# Patient Record
Sex: Female | Born: 2000 | Race: Black or African American | Hispanic: No | Marital: Single | Smoking: Never smoker
Health system: Southern US, Community
[De-identification: ages and names within clinical notes are randomized; demographics above are authoritative.]

## PROBLEM LIST (undated history)

## (undated) DIAGNOSIS — J45909 Unspecified asthma, uncomplicated: Secondary | ICD-10-CM

## (undated) DIAGNOSIS — F419 Anxiety disorder, unspecified: Secondary | ICD-10-CM

## (undated) HISTORY — DX: Unspecified asthma, uncomplicated: J45.909

## (undated) HISTORY — DX: Anxiety disorder, unspecified: F41.9

---
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
## 2020-04-03 ENCOUNTER — Ambulatory Visit (INDEPENDENT_AMBULATORY_CARE_PROVIDER_SITE_OTHER): Payer: Managed Care, Other (non HMO) | Admitting: Obstetrics & Gynecology

## 2020-04-03 ENCOUNTER — Other Ambulatory Visit: Payer: Self-pay

## 2020-04-03 ENCOUNTER — Encounter: Payer: Self-pay | Admitting: Obstetrics & Gynecology

## 2020-04-03 VITALS — BP 118/78 | HR 68 | Temp 97.5°F | Resp 16 | Ht 64.75 in | Wt 146.0 lb

## 2020-04-03 DIAGNOSIS — Z113 Encounter for screening for infections with a predominantly sexual mode of transmission: Secondary | ICD-10-CM

## 2020-04-03 DIAGNOSIS — Z01419 Encounter for gynecological examination (general) (routine) without abnormal findings: Secondary | ICD-10-CM | POA: Diagnosis not present

## 2020-04-03 MED ORDER — NORETHIN ACE-ETH ESTRAD-FE 1-20 MG-MCG PO TABS
1.0000 | ORAL_TABLET | Freq: Every day | ORAL | 3 refills | Status: DC
Start: 2020-04-03 — End: 2020-06-27

## 2020-04-03 NOTE — Progress Notes (Addendum)
19 y.o. G0P0000 Single Black or [place] American female here for new patient exam and to discuss contraception.  She is sexually active. This is her first partner and his first partner.   Options for contraception discussed today.    She finishing freshman year at [employer] this spring.  Not sure about summer plans.   Patient's last menstrual period was 03/20/2020 (exact date).          Sexually active: Yes.    The current method of family planning is none.    Exercising: No.  exercise Smoker:  no  Health Maintenance: Pap:  none History of abnormal Pap:  no MMG:  None Tdap: 2013 Screening Labs: none obtained   reports that she has never smoked. She has never used smokeless tobacco. She reports that she does not drink alcohol or use drugs.  Past Medical History:  Diagnosis Date  . Anxiety   . Asthma     History reviewed. No pertinent surgical history.  Current Outpatient Medications  Medication Sig Dispense Refill  . albuterol (PROVENTIL HFA;VENTOLIN HFA) 108 (90 Base) MCG/ACT inhaler Inhale 2 puffs into the lungs every 6 (six) hours as needed for wheezing. 1 Inhaler 1  . albuterol (PROVENTIL) (2.5 MG/3ML) 0.083% nebulizer solution Take 3 mLs (2.5 mg total) by nebulization every 4 (four) hours as needed for wheezing. 150 mL 1  . cetirizine (ZYRTEC) 10 MG tablet Take 1 tablet (10 mg total) by mouth daily. 30 tablet 11  . fluticasone (FLONASE) 50 MCG/ACT nasal spray Place 2 sprays into both nostrils daily. 16 g 6  . Spacer/Aero Chamber Mouthpiece MISC 1 each by Does not apply route every 6 (six) hours as needed. 1 each 0   No current facility-administered medications for this visit.    Family History  Problem Relation Age of Onset  . Hypertension Brother   . Ovarian cancer Maternal Grandmother        & cervical  . Diabetes Maternal Grandfather   . Ovarian cancer Paternal Grandmother        & cervical    Review of Systems  Constitutional: Negative.   HENT:  Negative.   Eyes: Negative.   Respiratory: Negative.   Cardiovascular: Negative.   Gastrointestinal: Negative.   Endocrine: Negative.   Genitourinary: Negative.   Musculoskeletal: Negative.   Skin: Negative.   Allergic/Immunologic: Negative.   Neurological: Negative.   Psychiatric/Behavioral: Negative.     Exam:   BP 118/78   Pulse 68   Temp (!) 97.5 F (36.4 C) (Skin)   Resp 16   Ht 5' 4.75" (1.645 m)   Wt 146 lb (66.2 kg)   LMP 03/20/2020 (Exact Date)   BMI 24.48 kg/m   Height: 5' 4.75" (164.5 cm)  General appearance: alert, cooperative and appears stated age Head: Normocephalic, without obvious abnormality, atraumatic Neck: no adenopathy, supple, symmetrical, trachea midline and thyroid normal to inspection and palpation Lungs: clear to auscultation bilaterally Breasts: normal appearance, no masses or tenderness Heart: regular rate and rhythm Abdomen: soft, non-tender; bowel sounds normal; no masses,  no organomegaly Extremities: extremities normal, atraumatic, no cyanosis or edema Skin: Skin color, texture, turgor normal. No rashes or lesions Lymph nodes: Cervical, supraclavicular, and axillary nodes normal. No abnormal inguinal nodes palpated Neurologic: Grossly normal   Pelvic: External genitalia:  no lesions              Urethra:  normal appearing urethra with no masses, tenderness or lesions  Bartholins and Skenes: normal                 Vagina: normal appearing vagina with normal color and discharge, no lesions              Cervix: no lesions              Pap taken: No. Bimanual Exam:  Uterus:  normal size, contour, position, consistency, mobility, non-tender              Adnexa: normal adnexa and no mass, fullness, tenderness               Rectovaginal: Confirms               Anus:  normal sphincter tone, no lesions  Chaperone, Royal Hawthorn, CMA, was present for exam.  A:  Well Woman with normal exam SA and desires  contraception Asthma Seasonal allergies  P:   Mammogram guidelines reviewed.   pap smear not indicated GC/Chl obtained today with dirty urine Will start Loestrin 1/20 FE one po q day with onset of cycle.  #1 month supply/3RD.  Recheck 3 months.  Risks discussed with pt in detail including DUB, DVT/PE, headache, nausea, increased BP.  Instruction sheet provided and reviewed with pt when to start and what to do if misses pill/pills. Release of records from [facility] for Gardisil vaccination dates. return annually or prn

## 2020-04-05 LAB — GC/CHLAMYDIA PROBE AMP
Chlamydia trachomatis, NAA: POSITIVE — AB
Neisseria Gonorrhoeae by PCR: NEGATIVE

## 2020-04-07 ENCOUNTER — Telehealth: Payer: Self-pay | Admitting: *Deleted

## 2020-04-07 MED ORDER — AZITHROMYCIN 1 G PO PACK: 1.0000 | PACK | Freq: Once | ORAL | 0 refills | Status: AC

## 2020-04-07 NOTE — Telephone Encounter (Signed)
Message left to return call to Triage Nurse at [phone].    

## 2020-04-07 NOTE — Telephone Encounter (Signed)
Attempted to reach patient to review results. Error message stating, "mail service has not been established on number you are trying to reach."

## 2020-04-07 NOTE — Telephone Encounter (Signed)
-----   Message from Jerene Bears, MD sent at 04/07/2020  1:33 PM EDT ----- Please let pt know her chlamydia testing is positive.  She told me that she and her partner both never had prior partners, just [place].  Needs treatment with azithromycin 1 gram po x 1.  Ok for expedited partner therapy as well.  Needs repeating testing 3 months.  CC:  Cornelia Copa, CMA

## 2020-04-07 NOTE — Telephone Encounter (Signed)
Call to patient. Message given to patient as seen below from Dr. Hyacinth Meeker and patient verbalized understanding. Patient declines EPT at this time. RN advised of importance of treatment for both patient and her partner. Advised to avoid intercourse until both are treated and use condoms. Patient verbalized understanding. Patient requests prescription be [facility] to [facility] [place]. Instructions on use of azithromycin reviewed with patient and she verbalized understanding. Prescription for azithromycin 1gm powder #1, 0RF sent to confirmed pharmacy. 3 month recheck scheduled for 06-27-20 at 1530. Patient agreeable to date and time of appointment.   Confidential Communicable Disease Report filled out and faxed to [facility] Attn: Brandy Sessoms.   Routing to provider and will close encounter.

## 2020-04-13 ENCOUNTER — Telehealth: Payer: Self-pay

## 2020-04-13 NOTE — Telephone Encounter (Signed)
Patient was notified that per [facility] records that she only completed 1 gardasil on 09-26-2015. She is aware that she only needs to get 2 more shots & does not need to restart vaccination. Patient declined to schedule 2nd gardasil appointment. She is aware of importance of having all 3 series complete. Routing to Dr Hyacinth Meeker.

## 2020-06-26 NOTE — Progress Notes (Signed)
GYNECOLOGY  VISIT  CC:   History of chlamydia, recheck  HPI: 19 y.o. G0P0000 Single Black or [place] American female here for toc of chlamydia.  She and boyfriend are not together.  She was asymptomatic from the chlamydia.    Not currently SA at this point.  Stopped OCPs as not SA.  Denies vaginal odor or discharge.    Additional STD testing discussed.  H/O Hep B vaccination as a child.  GYNECOLOGIC HISTORY: Patient's last menstrual period was 06/21/2020 (exact date). Contraception: abstinence Menopausal hormone therapy: none  Patient Active Problem List   Diagnosis Date Noted  . 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 Anxiety   . Asthma     History reviewed. No pertinent surgical history.  MEDS:   Current Outpatient Medications on File Prior to Visit  Medication Sig Dispense Refill  . albuterol (PROVENTIL HFA;VENTOLIN HFA) 108 (90 Base) MCG/ACT inhaler Inhale 2 puffs into the lungs every 6 (six) hours as needed for wheezing. 1 Inhaler 1  . albuterol (PROVENTIL) (2.5 MG/3ML) 0.083% nebulizer solution Take 3 mLs (2.5 mg total) by nebulization every 4 (four) hours as needed for wheezing. 150 mL 1  . cetirizine (ZYRTEC) 10 MG tablet Take 1 tablet (10 mg total) by mouth daily. 30 tablet 11  . ipratropium (ATROVENT) 0.06 % nasal spray Place 2 sprays into both nostrils 3 (three) times daily.    Marland Kitchen Spacer/Aero Chamber Mouthpiece MISC 1 each by Does not apply route every 6 (six) hours as needed. 1 each 0   No current facility-administered medications on file prior to visit.    ALLERGIES: Peanuts [peanut oil]  Family History  Problem Relation Age of Onset  . Hypertension Brother   . Ovarian cancer Maternal Grandmother        & cervical  . Diabetes Maternal Grandfather   . Ovarian cancer Paternal Grandmother        & cervical    SH:  Single, non smoker  Review of Systems  Constitutional: Negative.   HENT: Negative.   Eyes:  Negative.   Respiratory: Negative.   Cardiovascular: Negative.   Gastrointestinal: Negative.   Endocrine: Negative.   Genitourinary: Negative.   Musculoskeletal: Negative.   Skin: Negative.   Allergic/Immunologic: Negative.   Neurological: Negative.   Hematological: Negative.   Psychiatric/Behavioral: Negative.     PHYSICAL EXAMINATION:    BP 120/68   Pulse 68   Resp 16   Wt 147 lb (66.7 kg)   LMP 06/21/2020 (Exact Date)   BMI 24.65 kg/m     General appearance: alert, cooperative and appears stated age Abdomen: soft, non-tender; bowel sounds normal; no masses,  no organomegaly Lymph:  no inguinal LAD noted  Pelvic: External genitalia:  no lesions              Urethra:  normal appearing urethra with no masses, tenderness or lesions              Bartholins and Skenes: normal                 Vagina: normal appearing vagina with normal color and discharge, no lesions              Cervix: no lesions              Bimanual Exam:  Uterus:  normal size, contour, position, consistency, mobility, non-tender  Adnexa: no mass, fullness, tenderness  Chaperone, Cornelia Copa, CMA, was present for exam.  Assessment: H/o chlamydia Not currently SA and off contraception  Plan: GC/Chl/trich obtained today Will also check HIV, RPR, and Hep C antibody  .

## 2020-06-27 ENCOUNTER — Encounter: Payer: Self-pay | Admitting: Obstetrics & Gynecology

## 2020-06-27 ENCOUNTER — Ambulatory Visit (INDEPENDENT_AMBULATORY_CARE_PROVIDER_SITE_OTHER): Payer: Managed Care, Other (non HMO) | Admitting: Obstetrics & Gynecology

## 2020-06-27 ENCOUNTER — Other Ambulatory Visit: Payer: Self-pay

## 2020-06-27 VITALS — BP 120/68 | HR 68 | Resp 16 | Wt 147.0 lb

## 2020-06-27 DIAGNOSIS — Z8619 Personal history of other infectious and parasitic diseases: Secondary | ICD-10-CM

## 2020-06-28 LAB — RPR: RPR Ser Ql: NONREACTIVE

## 2020-06-28 LAB — HEPATITIS C ANTIBODY: Hep C Virus Ab: <0.1 s/co ratio (ref 0.0–0.9)

## 2020-06-28 LAB — HIV ANTIBODY (ROUTINE TESTING W REFLEX): HIV Screen 4th Generation wRfx: NONREACTIVE

## 2020-06-29 LAB — CHLAMYDIA/GONOCOCCUS/TRICHOMONAS, NAA
Chlamydia by NAA: NEGATIVE
Gonococcus by NAA: NEGATIVE
Trich vag by NAA: NEGATIVE

## 2020-07-03 ENCOUNTER — Telehealth: Payer: Self-pay

## 2020-07-03 NOTE — Telephone Encounter (Signed)
Patient cancelled office visit for 3 month recheck of OCP. Patient did not wish to reschedule.

## 2020-07-06 ENCOUNTER — Ambulatory Visit: Payer: Managed Care, Other (non HMO) | Admitting: Obstetrics & Gynecology

## 2020-09-01 ENCOUNTER — Encounter: Payer: Self-pay | Admitting: Family

## 2020-09-01 ENCOUNTER — Ambulatory Visit (INDEPENDENT_AMBULATORY_CARE_PROVIDER_SITE_OTHER): Payer: Managed Care, Other (non HMO) | Admitting: Family

## 2020-09-01 ENCOUNTER — Other Ambulatory Visit: Payer: Managed Care, Other (non HMO)

## 2020-09-01 DIAGNOSIS — J029 Acute pharyngitis, unspecified: Secondary | ICD-10-CM | POA: Diagnosis not present

## 2020-09-01 DIAGNOSIS — Z20822 Contact with and (suspected) exposure to covid-19: Secondary | ICD-10-CM | POA: Diagnosis not present

## 2020-09-01 NOTE — Progress Notes (Signed)
   Virtual Visit via telephone Note Due to COVID-19 pandemic this visit was conducted virtually. This visit type was conducted due to national recommendations for restrictions regarding the COVID-19 Pandemic (e.g. social distancing, sheltering in place) in an effort to limit this patient's exposure and mitigate transmission in our community. All issues noted in this document were discussed and addressed.  A physical exam was not performed with this format.  I connected with [name] on 09/01/20 at 9:25 AM  by telephone and verified that I am speaking with the correct person using two identifiers. Anne Pace is currently located at home and no one is currently with her during visit. The provider, Jannifer Rodney, FNP is located in their office at time of visit.  I discussed the limitations, risks, security and privacy concerns of performing an evaluation and management service by telephone and the availability of in person appointments. I also discussed with the patient that there may be a patient responsible charge related to this service. The patient expressed understanding and agreed to proceed.   History and Present Illness:  Cough This is a new problem. The current episode started in the past 7 days. The problem has been gradually worsening. The problem occurs constantly. The cough is non-productive. Associated symptoms include chills, ear congestion, ear pain, a fever, headaches, myalgias, nasal congestion and a sore throat. Pertinent negatives include no shortness of breath or wheezing. The symptoms are aggravated by lying down. She has tried rest and OTC cough suppressant for the symptoms. The treatment provided mild relief.      Review of Systems  Constitutional: Positive for chills and fever.  HENT: Positive for ear pain and sore throat.   Respiratory: Positive for cough. Negative for shortness of breath and wheezing.   Musculoskeletal: Positive for myalgias.  Neurological:  Positive for headaches.  All other systems reviewed and are negative.    Observations/Objective: No SOB or distress noted, hoarse vocie  Assessment and Plan: Anne Pace comes in today with chief complaint of No chief complaint on file.   Diagnosis and orders addressed:  1. Sore throat - Novel Coronavirus, NAA ([facility])  2. Encounter by telehealth for suspected COVID-19 Rest Force fluids Tylenol as needed OTC decongestant as needed    I discussed the assessment and treatment plan with the patient. The patient was provided an opportunity to ask questions and all were answered. The patient agreed with the plan and demonstrated an understanding of the instructions.   The patient was advised to call back or seek an in-person evaluation if the symptoms worsen or if the condition fails to improve as anticipated.  The above assessment and management plan was discussed with the patient. The patient verbalized understanding of and has agreed to the management plan. Patient is aware to call the clinic if symptoms persist or worsen. Patient is aware when to return to the clinic for a follow-up visit. Patient educated on when it is appropriate to go to the emergency department.   Time call ended:  9:34 AM   I provided 9  minutes of non-face-to-face time during this encounter.    Jannifer Rodney, FNP

## 2020-09-02 LAB — SARS-COV-2, NAA 2 DAY TAT

## 2020-09-02 LAB — NOVEL CORONAVIRUS, NAA: SARS-CoV-2, NAA: NOT DETECTED

## 2021-05-14 ENCOUNTER — Ambulatory Visit: Payer: Managed Care, Other (non HMO) | Admitting: Nurse Practitioner

## 2021-05-16 ENCOUNTER — Encounter: Payer: Self-pay | Admitting: Nurse Practitioner

## 2021-06-20 ENCOUNTER — Other Ambulatory Visit: Payer: Self-pay

## 2021-06-20 ENCOUNTER — Telehealth: Payer: Self-pay | Admitting: Nurse Practitioner

## 2021-06-20 DIAGNOSIS — J4531 Mild persistent asthma with (acute) exacerbation: Secondary | ICD-10-CM

## 2021-06-20 DIAGNOSIS — J4530 Mild persistent asthma, uncomplicated: Secondary | ICD-10-CM

## 2021-06-20 DIAGNOSIS — J4520 Mild intermittent asthma, uncomplicated: Secondary | ICD-10-CM

## 2021-06-20 MED ORDER — ALBUTEROL SULFATE HFA 108 (90 BASE) MCG/ACT IN AERS: 2.0000 | INHALATION_SPRAY | Freq: Four times a day (QID) | RESPIRATORY_TRACT | 0 refills | Status: DC | PRN

## 2021-06-20 MED ORDER — ALBUTEROL SULFATE (2.5 MG/3ML) 0.083% IN NEBU: 2.5000 mg | INHALATION_SOLUTION | RESPIRATORY_TRACT | 0 refills | Status: DC | PRN

## 2021-06-20 NOTE — Telephone Encounter (Signed)
  Prescription Request  06/20/2021  What is the name of the medication or equipment? albuterol (PROVENTIL HFA;VENTOLIN HFA) 108 (90 Base) MCG/ACT inhaler  Have you contacted your pharmacy to request a refill? (if applicable) no  Which pharmacy would you like this sent to? [facility] has apt 06/28/2021   Patient notified that their request is being sent to the clinical staff for review and that they should receive a response within 2 business days.

## 2021-06-20 NOTE — Telephone Encounter (Signed)
Sent in one refill. Pt is scheduled with Gennette Pac on 06/28/21.

## 2021-06-28 ENCOUNTER — Encounter: Payer: Self-pay | Admitting: Nurse Practitioner

## 2021-06-28 ENCOUNTER — Ambulatory Visit: Payer: Managed Care, Other (non HMO) | Admitting: Nurse Practitioner

## 2021-11-28 ENCOUNTER — Encounter: Payer: Self-pay | Admitting: Family Medicine

## 2021-11-28 ENCOUNTER — Ambulatory Visit: Payer: Managed Care, Other (non HMO) | Admitting: Family Medicine

## 2021-11-28 VITALS — BP 126/85 | HR 130 | Temp 100.6°F | Ht 64.75 in | Wt 143.0 lb

## 2021-11-28 DIAGNOSIS — J029 Acute pharyngitis, unspecified: Secondary | ICD-10-CM | POA: Diagnosis not present

## 2021-11-28 DIAGNOSIS — R21 Rash and other nonspecific skin eruption: Secondary | ICD-10-CM | POA: Diagnosis not present

## 2021-11-28 NOTE — Progress Notes (Signed)
Assessment & Plan:  1. Sore throat Continue Augmentin. Possible mono given her symptoms. Education provided on mono.  - Mononucleosis Test, Qual W/ Reflex  2. Rash Advised to take famotidine and antihistamine at onset of rash if it returns.    Follow up plan: Return if symptoms worsen or fail to improve.  Deliah Boston, MSN, APRN, FNP-C [facility]  Subjective:   Patient ID: Anne Pace, female    DOB: 2001-01-13, 20 y.o.   MRN: [id number]  HPI: Anne Pace is a 20 y.o. female presenting on 11/28/2021 for Follow-up (Urgent care follow up- 12/16. Patient states that she is still having sore throat, congestion & headache )  Patient is accompanied by her mom, who she is okay with being present.  Patient has had a headache, sore throat, and fever x1 week. She was seen at urgent care two days ago at which time her COVID and influenza tests were negative. Patient reports they also tested her for strep throat - rapid negative, culture sent off. She has not heard from the culture results. These results are not available in Epic. She was started on Augmentin at that time and reports no improvement in her symptoms. At the same time her throat started hurting, she also broke out in a rash which has been treated with steroids and has improved.    ROS: Negative unless specifically indicated above in HPI.   Relevant past medical history reviewed and updated as indicated.   Allergies and medications reviewed and updated.   Current Outpatient Medications:    albuterol (PROVENTIL) (2.5 MG/3ML) 0.083% nebulizer solution, Take 3 mLs (2.5 mg total) by nebulization every 4 (four) hours as needed for wheezing., Disp: 150 mL, Rfl: 0   albuterol (VENTOLIN HFA) 108 (90 Base) MCG/ACT inhaler, Inhale 2 puffs into the lungs every 6 (six) hours as needed for wheezing., Disp: 1 each, Rfl: 0   amoxicillin (AMOXIL) 875 MG tablet, Take 1 tablet by mouth daily., Disp: , Rfl:     cetirizine (ZYRTEC) 10 MG tablet, Take 1 tablet (10 mg total) by mouth daily., Disp: 30 tablet, Rfl: 11   ipratropium (ATROVENT) 0.06 % nasal spray, Place 2 sprays into both nostrils 3 (three) times daily., Disp: , Rfl:    predniSONE (DELTASONE) 5 MG tablet, Take 6-5-4-3-2-1 po qd, Disp: , Rfl:    Spacer/Aero Chamber Mouthpiece MISC, 1 each by Does not apply route every 6 (six) hours as needed., Disp: 1 each, Rfl: 0  Allergies  Allergen Reactions   Peanut-Containing Drug Products Hives   Peanuts [Peanut Oil]     Objective:   BP 126/85    Pulse (!) 130    Temp (!) 100.6 F (38.1 C) (Temporal)    Ht 5' 4.75" (1.645 m)    Wt 143 lb (64.9 kg)    SpO2 98%    BMI 23.98 kg/m    Physical Exam Vitals reviewed.  Constitutional:      General: She is not in acute distress.    Appearance: Normal appearance. She is not ill-appearing, toxic-appearing or diaphoretic.  HENT:     Head: Normocephalic and atraumatic.     Right Ear: Ear canal and external ear normal. There is impacted cerumen.     Left Ear: Ear canal and external ear normal. There is impacted cerumen.     Nose:     Right Sinus: No maxillary sinus tenderness or frontal sinus tenderness.     Left Sinus: No maxillary  sinus tenderness or frontal sinus tenderness.     Mouth/Throat:     Mouth: Mucous membranes are moist.     Pharynx: Oropharyngeal exudate and posterior oropharyngeal erythema present.     Tonsils: Tonsillar exudate present. 4+ on the right. 4+ on the left.  Eyes:     General: No scleral icterus.       Right eye: No discharge.        Left eye: No discharge.     Conjunctiva/sclera: Conjunctivae normal.  Cardiovascular:     Rate and Rhythm: Normal rate and regular rhythm.     Heart sounds: Normal heart sounds. No murmur heard.   No friction rub. No gallop.  Pulmonary:     Effort: Pulmonary effort is normal. No respiratory distress.     Breath sounds: Normal breath sounds. No stridor. No wheezing, rhonchi or rales.   Musculoskeletal:        General: Normal range of motion.     Cervical back: Normal range of motion.  Lymphadenopathy:     Cervical: No cervical adenopathy.  Skin:    General: Skin is warm and dry.     Capillary Refill: Capillary refill takes less than 2 seconds.  Neurological:     General: No focal deficit present.     Mental Status: She is alert and oriented to person, place, and time. Mental status is at baseline.  Psychiatric:        Mood and Affect: Mood normal.        Behavior: Behavior normal.        Thought Content: Thought content normal.        Judgment: Judgment normal.

## 2021-11-29 LAB — MONO QUAL W/RFLX QN: Mono Qual W/Rflx Qn: POSITIVE — AB

## 2021-11-29 LAB — MONONUCLEOSIS TEST, QUANT: MONO TITER: 1:8 {titer} — ABNORMAL HIGH

## 2022-05-30 ENCOUNTER — Ambulatory Visit
Admission: RE | Admit: 2022-05-30 | Discharge: 2022-05-30 | Disposition: A | Discharge status: Home / Self Care | Payer: 59 | Source: Ambulatory Visit | Attending: Family Medicine | Admitting: Family Medicine

## 2022-05-30 VITALS — BP 119/82 | HR 98 | Temp 98.9°F | Resp 18

## 2022-05-30 DIAGNOSIS — J069 Acute upper respiratory infection, unspecified: Secondary | ICD-10-CM | POA: Diagnosis not present

## 2022-05-30 LAB — POCT RAPID STREP A (OFFICE): Rapid Strep A Screen: NEGATIVE

## 2022-05-30 MED ORDER — PROMETHAZINE-DM 6.25-15 MG/5ML PO SYRP: 5.0000 mL | ORAL_SOLUTION | Freq: Four times a day (QID) | ORAL | 0 refills | Status: DC | PRN

## 2022-05-30 NOTE — ED Triage Notes (Signed)
Pt reports fever, nasal congestion and sore throat x 3 days. Tylenol gives relief,

## 2022-05-30 NOTE — ED Provider Notes (Signed)
[facility]    CSN: [id number] Arrival date & time: 05/30/22  1551      History   Chief Complaint Chief Complaint  Patient presents with   Fever    Nasal Congestion, Sore Throat, Body Aches - Entered by patient   Nasal Congestion        Sore Throat         HPI Anne Pace is a 21 y.o. female.   Presenting today with 3-day history of fever, chills, body aches, congestion, sore throat, cough.  Denies chest pain, shortness of breath, abdominal pain, nausea vomiting or diarrhea.  Trying over-the-counter cold and congestion medications with minimal relief.  History of asthma on albuterol as needed, has not needed her inhaler since onset of symptoms.  No known sick contacts recently.    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 No pertinent surgical history.  OB History     Gravida  0   Para  0   Term  0   Preterm  0   AB  0   Living  0      SAB  0   IAB  0   Ectopic  0   Multiple  0   Live Births  0            Home Medications    Prior to Admission medications   Medication Sig Start Date End Date Taking? Authorizing Provider  promethazine-dextromethorphan (PROMETHAZINE-DM) 6.25-15 MG/5ML syrup Take 5 mLs by mouth 4 (four) times daily as needed. 05/30/22  Yes Particia Nearing, PA-C  albuterol (PROVENTIL) (2.5 MG/3ML) 0.083% nebulizer solution Take 3 mLs (2.5 mg total) by nebulization every 4 (four) hours as needed for wheezing. 06/20/21   Daphine Deutscher Mary-Margaret, FNP  albuterol (VENTOLIN HFA) 108 (90 Base) MCG/ACT inhaler Inhale 2 puffs into the lungs every 6 (six) hours as needed for wheezing. 06/20/21   Daphine Deutscher, Mary-Margaret, FNP  amoxicillin (AMOXIL) 875 MG tablet Take 1 tablet by mouth daily.    [provider]  cetirizine (ZYRTEC) 10 MG tablet Take 1  tablet (10 mg total) by mouth daily. 02/08/17   Johna Sheriff, MD  ipratropium (ATROVENT) 0.06 % nasal spray Place 2 sprays into both nostrils 3 (three) times daily. 06/21/20   [provider]  predniSONE (DELTASONE) 5 MG tablet Take 6-5-4-3-2-1 po qd 11/19/21   [provider]  Spacer/Aero Chamber Mouthpiece MISC 1 each by Does not apply route every 6 (six) hours as needed. 02/08/17   Johna Sheriff, MD    Family History Family History  Problem Relation Age of Onset   Hypertension Brother    Ovarian cancer Maternal Grandmother        & cervical   Diabetes Maternal Grandfather    Ovarian cancer Paternal Grandmother        & cervical    Social History Social History   Tobacco Use   Smoking status: Never   Smokeless tobacco: Never  Substance Use Topics   Alcohol use: Never   Drug use: Never     Allergies   Peanut-containing drug products and Peanuts [peanut oil]   Review of Systems Review of Systems Per HPI  Physical Exam Triage Vital Signs ED Triage Vitals [05/30/22 1604]  Enc Vitals Group  BP 119/82     Pulse Rate 98     Resp 18     Temp 98.9 F (37.2 C)     Temp Source Oral     SpO2 99 %     Weight      Height      Head Circumference      Peak Flow      Pain Score 5     Pain Loc      Pain Edu?      Excl. in GC?    No data found.  Updated Vital Signs BP 119/82 (BP Location: Right Arm)   Pulse 98   Temp 98.9 F (37.2 C) (Oral)   Resp 18   LMP 05/26/2022 (Exact Date)   SpO2 99%   Visual Acuity Right Eye Distance:   Left Eye Distance:   Bilateral Distance:    Right Eye Near:   Left Eye Near:    Bilateral Near:     Physical Exam Vitals and nursing note reviewed.  Constitutional:      Appearance: Normal appearance.  HENT:     Head: Atraumatic.     Right Ear: Tympanic membrane and external ear normal.     Left Ear: Tympanic membrane and external ear normal.     Nose: Rhinorrhea present.     Mouth/Throat:     Mouth:  Mucous membranes are moist.     Pharynx: Posterior oropharyngeal erythema present.  Eyes:     Extraocular Movements: Extraocular movements intact.     Conjunctiva/sclera: Conjunctivae normal.  Cardiovascular:     Rate and Rhythm: Normal rate and regular rhythm.     Heart sounds: Normal heart sounds.  Pulmonary:     Effort: Pulmonary effort is normal.     Breath sounds: Normal breath sounds. No wheezing or rales.  Musculoskeletal:        General: Normal range of motion.     Cervical back: Normal range of motion and neck supple.  Skin:    General: Skin is warm and dry.  Neurological:     Mental Status: She is alert and oriented to person, place, and time.  Psychiatric:        Mood and Affect: Mood normal.        Thought Content: Thought content normal.      UC Treatments / Results  Labs (all labs ordered are listed, but only abnormal results are displayed) Labs Reviewed  COVID-19, FLU A+B NAA  POCT RAPID STREP A (OFFICE)    EKG   Radiology No results found.  Procedures Procedures (including critical care time)  Medications Ordered in UC Medications - No data to display  Initial Impression / Assessment and Plan / UC Course  I have reviewed the triage vital signs and the nursing notes.  Pertinent labs & imaging results that were available during my care of the patient were reviewed by me and considered in my medical decision making (see chart for details).     Rapid strep negative, COVID and flu testing pending.  Treat with Phenergan DM, supportive over-the-counter medications and home care.  Return for worsening symptoms.  Work note given.  Final Clinical Impressions(s) / UC Diagnoses   Final diagnoses:  Viral URI with cough   Discharge Instructions   None    ED Prescriptions     Medication Sig Dispense Auth. Provider   promethazine-dextromethorphan (PROMETHAZINE-DM) 6.25-15 MG/5ML syrup Take 5 mLs by mouth 4 (four) times daily as needed. 100  mL Particia Nearing, PA-C      PDMP not reviewed this encounter.   Particia Nearing, [place] 05/30/22 Paulo Fruit

## 2022-05-31 LAB — COVID-19, FLU A+B NAA
Influenza A, NAA: NOT DETECTED
Influenza B, NAA: NOT DETECTED
SARS-CoV-2, NAA: NOT DETECTED

## 2022-08-22 ENCOUNTER — Ambulatory Visit: Payer: 59 | Admitting: Family

## 2022-08-22 ENCOUNTER — Encounter: Payer: Self-pay | Admitting: Family

## 2022-08-22 VITALS — BP 130/82 | HR 115 | Temp 98.2°F | Ht 64.0 in | Wt 146.2 lb

## 2022-08-22 DIAGNOSIS — Z23 Encounter for immunization: Secondary | ICD-10-CM

## 2022-08-22 DIAGNOSIS — Z91010 Allergy to peanuts: Secondary | ICD-10-CM | POA: Diagnosis not present

## 2022-08-22 DIAGNOSIS — H0015 Chalazion left lower eyelid: Secondary | ICD-10-CM | POA: Diagnosis not present

## 2022-08-22 MED ORDER — EPINEPHRINE 0.3 MG/0.3ML IJ SOAJ: 0.3000 mg | INTRAMUSCULAR | 1 refills | Status: DC | PRN

## 2022-08-22 NOTE — Progress Notes (Signed)
   Subjective:    Patient ID: Anne Pace, female    DOB: [birth year], 21 y.o.   MRN: [id number]  Chief Complaint  Patient presents with   Stye    HPI Pt presents to the office today with stye in left eye that she noticed two months ago. Reports the area is painless and has not changed. Denies any fevers.   Reports she ate peanut butter yesterday and had right eye erythemas and hives. This is improving. She took benadryl.    Review of Systems  All other systems reviewed and are negative.      Objective:   Physical Exam Vitals reviewed.  Constitutional:      General: She is not in acute distress.    Appearance: She is well-developed.  HENT:     Head: Normocephalic and atraumatic.     Right Ear: Tympanic membrane normal.     Left Ear: Tympanic membrane normal.  Eyes:     Conjunctiva/sclera:     Right eye: Right conjunctiva is injected.     Pupils: Pupils are equal, round, and reactive to light.      Comments: Small cyst on left lower lid and upper, no redness, fever or discharge present   Neck:     Thyroid: No thyromegaly.  Cardiovascular:     Rate and Rhythm: Normal rate and regular rhythm.     Heart sounds: Normal heart sounds. No murmur heard. Pulmonary:     Effort: Pulmonary effort is normal. No respiratory distress.     Breath sounds: Normal breath sounds. No wheezing.  Abdominal:     General: Bowel sounds are normal. There is no distension.     Palpations: Abdomen is soft.     Tenderness: There is no abdominal tenderness.  Musculoskeletal:        General: No tenderness. Normal range of motion.     Cervical back: Normal range of motion and neck supple.  Skin:    General: Skin is warm and dry.  Neurological:     Mental Status: She is alert and oriented to person, place, and time.     Cranial Nerves: No cranial nerve deficit.     Deep Tendon Reflexes: Reflexes are normal and symmetric.  Psychiatric:        Behavior: Behavior normal.        Thought Content:  Thought content normal.        Judgment: Judgment normal.      BP 130/82   Pulse (!) 115   Temp 98.2 F (36.8 C)   Ht 5\' 4"  (1.626 m)   Wt 146 lb 3.2 oz (66.3 kg)   SpO2 99%   BMI 25.10 kg/m       Assessment & Plan:   Anne Pace comes in today with chief complaint of Stye   Diagnosis and orders addressed:  1. Need for immunization against influenza - Flu Vaccine QUAD 82mo+IM (Fluarix, Fluzone & Alfiuria Quad PF)  2. Allergic to peanuts Continue zyrtec  Benadryl as needed  Avoid all nuts - EPINEPHrine 0.3 mg/0.3 mL IJ SOAJ injection; Inject 0.3 mg into the muscle as needed for anaphylaxis.  Dispense: 1 each; Refill: 1  3. Chalazion of left lower eyelid Warm compress Avoid rubbing If wants removed will need to follow up with ophthalmologist     5mo, FNP

## 2022-08-22 NOTE — Patient Instructions (Signed)
Chalazion  A chalazion is a swelling or lump on the eyelid. It can affect the upper eyelid or the lower eyelid. What are the causes? This condition may be caused by: Long-lasting (chronic) inflammation of the eyelid glands. A blocked oil gland in the eyelid. What are the signs or symptoms? Symptoms of this condition include: Swelling of the eyelid that: May spread to areas around the eye. May be painful. A hard lump on the eyelid. Blurry vision. The lump may make it hard to see out of the eye. How is this diagnosed? This condition is diagnosed with an examination of the eye. How is this treated? This condition is treated by applying a warm, moist cloth (warm compress) to the eyelid. If the condition does not improve, it may be treated with: Medicine that is applied to the eye. Oral medicines. Medicine that is injected into the chalazion. Surgery. Follow these instructions at home: Managing pain and swelling Apply a warm compress to the eyelid for 10-15 minutes, 4 to 6 times a day. This will help to open any blocked glands and to reduce redness and swelling. Take and apply over-the-counter and prescription medicines only as told by your health care provider. General instructions Do not touch the chalazion. Do not try to remove the pus. Do not squeeze the chalazion or stick it with a pin or needle. Do not rub your eyes. Wash your hands often with soap and water for at least 20 seconds. Dry your hands with a clean towel. Keep your face, scalp, and eyebrows clean. Avoid wearing eye makeup. Keep all follow-up visits. This is important. Contact a health care provider if: Your eyelid is getting worse. You have a fever. The chalazion does not break open (rupture) or go away on its own and your eyelid has not improved for 4 weeks. Get help right away if: You have pain in your eye. Your vision worsens. The chalazion becomes painful or red. The chalazion gets bigger. Summary A  chalazion is a swelling or lump on the upper or lower eyelid. It may be caused by chronic inflammation or a blocked oil gland. Apply a warm compress to the eyelid for 10-15 minutes, 4 to 6 times a day. Keep your face, scalp, and eyebrows clean. This information is not intended to replace advice given to you by your health care provider. Make sure you discuss any questions you have with your health care provider. Document Revised: 01/31/2021 Document Reviewed: 01/31/2021 Elsevier Patient Education  2023 Elsevier Inc.  

## 2022-09-17 ENCOUNTER — Other Ambulatory Visit (HOSPITAL_COMMUNITY)
Admission: RE | Admit: 2022-09-17 | Discharge: 2022-09-17 | Disposition: A | Discharge status: Home / Self Care | Payer: 59 | Source: Ambulatory Visit | Attending: Family | Admitting: Family

## 2022-09-17 ENCOUNTER — Encounter: Payer: Self-pay | Admitting: Family

## 2022-09-17 ENCOUNTER — Ambulatory Visit (INDEPENDENT_AMBULATORY_CARE_PROVIDER_SITE_OTHER): Payer: 59 | Admitting: Family

## 2022-09-17 VITALS — BP 120/82 | HR 92 | Temp 98.2°F | Ht 64.0 in | Wt 149.0 lb

## 2022-09-17 DIAGNOSIS — Z91018 Allergy to other foods: Secondary | ICD-10-CM

## 2022-09-17 DIAGNOSIS — Z0001 Encounter for general adult medical examination with abnormal findings: Secondary | ICD-10-CM

## 2022-09-17 DIAGNOSIS — Z23 Encounter for immunization: Secondary | ICD-10-CM | POA: Diagnosis not present

## 2022-09-17 DIAGNOSIS — J45990 Exercise induced bronchospasm: Secondary | ICD-10-CM

## 2022-09-17 DIAGNOSIS — Z0000 Encounter for general adult medical examination without abnormal findings: Secondary | ICD-10-CM | POA: Diagnosis present

## 2022-09-17 DIAGNOSIS — Z202 Contact with and (suspected) exposure to infections with a predominantly sexual mode of transmission: Secondary | ICD-10-CM | POA: Insufficient documentation

## 2022-09-17 NOTE — Patient Instructions (Signed)

## 2022-09-17 NOTE — Progress Notes (Signed)
   Subjective:    Patient ID: Anne Pace, female    DOB: Dec 17, 2000, 21 y.o.   MRN: [id number]  Chief Complaint  Patient presents with   Annual Exam   PT presents to the office today with CPE without pap.  Asthma There is no cough, shortness of breath or wheezing. The current episode started more than 1 year ago. Her symptoms are aggravated by pollen. Her past medical history is significant for asthma.      Review of Systems  Respiratory:  Negative for cough, shortness of breath and wheezing.   All other systems reviewed and are negative.      Objective:   Physical Exam Vitals reviewed.  Constitutional:      General: She is not in acute distress.    Appearance: She is well-developed.  HENT:     Head: Normocephalic and atraumatic.     Right Ear: Tympanic membrane normal.     Left Ear: Tympanic membrane normal.  Eyes:     Pupils: Pupils are equal, round, and reactive to light.  Neck:     Thyroid: No thyromegaly.  Cardiovascular:     Rate and Rhythm: Normal rate and regular rhythm.     Heart sounds: Normal heart sounds. No murmur heard. Pulmonary:     Effort: Pulmonary effort is normal. No respiratory distress.     Breath sounds: Normal breath sounds. No wheezing.  Abdominal:     General: Bowel sounds are normal. There is no distension.     Palpations: Abdomen is soft.     Tenderness: There is no abdominal tenderness.  Musculoskeletal:        General: No tenderness. Normal range of motion.     Cervical back: Normal range of motion and neck supple.  Skin:    General: Skin is warm and dry.  Neurological:     Mental Status: She is alert and oriented to person, place, and time.     Cranial Nerves: No cranial nerve deficit.     Deep Tendon Reflexes: Reflexes are normal and symmetric.  Psychiatric:        Behavior: Behavior normal.        Thought Content: Thought content normal.        Judgment: Judgment normal.     BP 120/82   Pulse 92   Temp 98.2 F (36.8 C)  (Temporal)   Ht _0  (1.626 m)   Wt 149 lb (67.6 kg)   SpO2 99%   BMI 25.58 kg/m      Assessment & Plan:  Anne Pace comes in today with chief complaint of Annual Exam   Diagnosis and orders addressed:  1. Annual physical exam - CMP14+EGFR - CBC with Differential/Platelet - Lipid panel - TSH - HepB+HepC+HIV Panel - [facility] - Urine cytology ancillary only  2. Exercise-induced asthma  3. Allergy to nuts  4. Possible exposure to STD - HepB+HepC+HIV Panel - RPR - Urine cytology ancillary only   Labs pending Health Maintenance reviewed Diet and exercise encouraged  Follow up plan: 1 year    Evelina Dun, FNP

## 2022-09-18 LAB — LIPID PANEL
Chol/HDL Ratio: 2.2 ratio (ref 0.0–4.4)
Cholesterol, Total: 138 mg/dL (ref 100–199)
HDL: 64 mg/dL (ref >39)
LDL Chol Calc (NIH): 64 mg/dL (ref 0–99)
Triglycerides: 40 mg/dL (ref 0–149)
VLDL Cholesterol Cal: 10 mg/dL (ref 5–40)

## 2022-09-18 LAB — CMP14+EGFR
ALT: 7 IU/L (ref 0–32)
AST: 16 IU/L (ref 0–40)
Albumin/Globulin Ratio: 1.4 (ref 1.2–2.2)
Albumin: 4.2 g/dL (ref 4.0–5.0)
Alkaline Phosphatase: 95 IU/L (ref 44–121)
BUN/Creatinine Ratio: 12 (ref 9–23)
BUN: 9 mg/dL (ref 6–20)
Bilirubin Total: 1 mg/dL (ref 0.0–1.2)
CO2: 19 mmol/L — ABNORMAL LOW (ref 20–29)
Calcium: 9.3 mg/dL (ref 8.7–10.2)
Chloride: 102 mmol/L (ref 96–106)
Creatinine, Ser: 0.74 mg/dL (ref 0.57–1.00)
Globulin, Total: 2.9 g/dL (ref 1.5–4.5)
Glucose: 93 mg/dL (ref 70–99)
Potassium: 4.3 mmol/L (ref 3.5–5.2)
Sodium: 138 mmol/L (ref 134–144)
Total Protein: 7.1 g/dL (ref 6.0–8.5)
eGFR: 118 mL/min/{1.73_m2} (ref >59)

## 2022-09-18 LAB — CBC WITH DIFFERENTIAL/PLATELET
Basophils Absolute: 0 10*3/uL (ref 0.0–0.2)
Basos: 1 %
EOS (ABSOLUTE): 0.1 10*3/uL (ref 0.0–0.4)
Eos: 2 %
Hematocrit: 36.5 % (ref 34.0–46.6)
Hemoglobin: 11.2 g/dL (ref 11.1–15.9)
Immature Grans (Abs): 0 10*3/uL (ref 0.0–0.1)
Immature Granulocytes: 0 %
Lymphocytes Absolute: 1.7 10*3/uL (ref 0.7–3.1)
Lymphs: 42 %
MCH: 23.3 pg — ABNORMAL LOW (ref 26.6–33.0)
MCHC: 30.7 g/dL — ABNORMAL LOW (ref 31.5–35.7)
MCV: 76 fL — ABNORMAL LOW (ref 79–97)
Monocytes Absolute: 0.4 10*3/uL (ref 0.1–0.9)
Monocytes: 10 %
Neutrophils Absolute: 1.9 10*3/uL (ref 1.4–7.0)
Neutrophils: 45 %
Platelets: 418 10*3/uL (ref 150–450)
RBC: 4.81 x10E6/uL (ref 3.77–5.28)
RDW: 15.7 % — ABNORMAL HIGH (ref 11.7–15.4)
WBC: 4.1 10*3/uL (ref 3.4–10.8)

## 2022-09-18 LAB — HEPB+HEPC+HIV PANEL
HIV Screen 4th Generation wRfx: NONREACTIVE
Hep B C IgM: NEGATIVE
Hep B Core Total Ab: NEGATIVE
Hep B E Ab: NEGATIVE
Hep B E Ag: NEGATIVE
Hep B Surface Ab, Qual: NONREACTIVE
Hep C Virus Ab: NONREACTIVE
Hepatitis B Surface Ag: NEGATIVE

## 2022-09-18 LAB — URINE CYTOLOGY ANCILLARY ONLY
Chlamydia: NEGATIVE
Comment: NEGATIVE
Comment: NEGATIVE
Comment: NORMAL
Neisseria Gonorrhea: NEGATIVE
Trichomonas: NEGATIVE

## 2022-09-18 LAB — RPR: RPR Ser Ql: NONREACTIVE

## 2022-09-18 LAB — TSH: TSH: 1.27 u[IU]/mL (ref 0.450–4.500)

## 2023-04-27 ENCOUNTER — Encounter (HOSPITAL_COMMUNITY): Payer: Self-pay | Admitting: Emergency Medicine

## 2023-04-27 ENCOUNTER — Emergency Department (HOSPITAL_COMMUNITY): Payer: 59

## 2023-04-27 ENCOUNTER — Emergency Department (HOSPITAL_COMMUNITY)
Admission: EM | Admit: 2023-04-27 | Discharge: 2023-04-27 | Disposition: A | Discharge status: Home / Self Care | Payer: 59 | Attending: Emergency Medicine | Admitting: Emergency Medicine

## 2023-04-27 ENCOUNTER — Other Ambulatory Visit: Payer: Self-pay

## 2023-04-27 DIAGNOSIS — R112 Nausea with vomiting, unspecified: Secondary | ICD-10-CM | POA: Diagnosis not present

## 2023-04-27 DIAGNOSIS — R1031 Right lower quadrant pain: Secondary | ICD-10-CM | POA: Diagnosis present

## 2023-04-27 DIAGNOSIS — J45909 Unspecified asthma, uncomplicated: Secondary | ICD-10-CM | POA: Diagnosis not present

## 2023-04-27 DIAGNOSIS — N390 Urinary tract infection, site not specified: Secondary | ICD-10-CM

## 2023-04-27 LAB — CBC
HCT: 37.7 % (ref 36.0–46.0)
Hemoglobin: 11.7 g/dL — ABNORMAL LOW (ref 12.0–15.0)
MCH: 23 pg — ABNORMAL LOW (ref 26.0–34.0)
MCHC: 31 g/dL (ref 30.0–36.0)
MCV: 74.1 fL — ABNORMAL LOW (ref 80.0–100.0)
Platelets: 381 10*3/uL (ref 150–400)
RBC: 5.09 MIL/uL (ref 3.87–5.11)
RDW: 15.5 % (ref 11.5–15.5)
WBC: 7.1 10*3/uL (ref 4.0–10.5)
nRBC: 0 % (ref 0.0–0.2)

## 2023-04-27 LAB — URINALYSIS, ROUTINE W REFLEX MICROSCOPIC
Bilirubin Urine: NEGATIVE
Glucose, UA: NEGATIVE mg/dL
Ketones, ur: NEGATIVE mg/dL
Nitrite: POSITIVE — AB
Specific Gravity, Urine: 1.025 (ref 1.005–1.030)
pH: 6 (ref 5.0–8.0)

## 2023-04-27 LAB — COMPREHENSIVE METABOLIC PANEL
ALT: 12 U/L (ref 0–44)
AST: 19 U/L (ref 15–41)
Albumin: 3.9 g/dL (ref 3.5–5.0)
Alkaline Phosphatase: 95 U/L (ref 38–126)
Anion gap: 12 (ref 5–15)
BUN: 11 mg/dL (ref 6–20)
CO2: 19 mmol/L — ABNORMAL LOW (ref 22–32)
Calcium: 9 mg/dL (ref 8.9–10.3)
Chloride: 103 mmol/L (ref 98–111)
Creatinine, Ser: 0.77 mg/dL (ref 0.44–1.00)
GFR, Estimated: >60 mL/min (ref >60)
Glucose, Bld: 118 mg/dL — ABNORMAL HIGH (ref 70–99)
Potassium: 3.1 mmol/L — ABNORMAL LOW (ref 3.5–5.1)
Sodium: 134 mmol/L — ABNORMAL LOW (ref 135–145)
Total Bilirubin: 0.9 mg/dL (ref 0.3–1.2)
Total Protein: 8.1 g/dL (ref 6.5–8.1)

## 2023-04-27 LAB — URINALYSIS, MICROSCOPIC (REFLEX)

## 2023-04-27 LAB — LIPASE, BLOOD: Lipase: 32 U/L (ref 11–51)

## 2023-04-27 LAB — POC URINE PREG, ED: Preg Test, Ur: NEGATIVE

## 2023-04-27 MED ORDER — KETOROLAC TROMETHAMINE 15 MG/ML IJ SOLN
15.0000 mg | Freq: Once | INTRAMUSCULAR | Status: AC
  Administered 2023-04-27: 15 mg via INTRAVENOUS
  Filled 2023-04-27: qty 1

## 2023-04-27 MED ORDER — ONDANSETRON HCL 4 MG/2ML IJ SOLN
4.0000 mg | Freq: Once | INTRAMUSCULAR | Status: AC
  Administered 2023-04-27: 4 mg via INTRAVENOUS
  Filled 2023-04-27: qty 2

## 2023-04-27 MED ORDER — SODIUM CHLORIDE 0.9 % IV BOLUS
1000.0000 mL | Freq: Once | INTRAVENOUS | Status: AC
  Administered 2023-04-27: 1000 mL via INTRAVENOUS

## 2023-04-27 MED ORDER — IOHEXOL 300 MG/ML  SOLN
100.0000 mL | Freq: Once | INTRAMUSCULAR | Status: AC | PRN
  Administered 2023-04-27: 100 mL via INTRAVENOUS

## 2023-04-27 MED ORDER — CEPHALEXIN 500 MG PO CAPS: 500.0000 mg | ORAL_CAPSULE | Freq: Three times a day (TID) | ORAL | 0 refills | Status: DC

## 2023-04-27 MED ORDER — ONDANSETRON 4 MG PO TBDP: 4.0000 mg | ORAL_TABLET | Freq: Three times a day (TID) | ORAL | 0 refills | Status: DC | PRN

## 2023-04-27 NOTE — ED Provider Notes (Signed)
AP-EMERGENCY DEPT [facility] Emergency Department Provider Note MRN:  [id number]  Arrival date & time: 04/27/23     Chief Complaint   Abdominal Pain and Emesis   History of Present Illness   Anne Pace is a 22 y.o. year-old female with no pertinent past medical history presenting to the ED with chief complaint of abdominal pain.  Right lower quadrant abdominal pain starting last night, she explains that she has been tossing and turning all night getting very little sleep because of the discomfort.  Earlier this morning started having nausea, 2 episodes of vomiting.  Denies diarrhea, denies fever.  Review of Systems  A thorough review of systems was obtained and all systems are negative except as noted in the HPI and PMH.   Patient's Health History    Past Medical History:  Diagnosis Date   Anxiety    Asthma     History reviewed. No pertinent surgical history.  Family History  Problem Relation Age of Onset   Hypertension Brother    Ovarian cancer Maternal Grandmother        & cervical   Diabetes Maternal Grandfather    Ovarian cancer Paternal Grandmother        & cervical    Social History   Socioeconomic History   Marital status: Single    Spouse name: Not on file   Number of children: Not on file   Years of education: Not on file   Highest education level: Not on file  Occupational History   Not on file  Tobacco Use   Smoking status: Never   Smokeless tobacco: Never  Substance and Sexual Activity   Alcohol use: Never   Drug use: Never   Sexual activity: Not Currently    Partners: Male    Birth control/protection: Abstinence  Other Topics Concern   Not on file  Social History Narrative   Not on file   Social Determinants of Health   Financial Resource Strain: Not on file  Food Insecurity: Not on file  Transportation Needs: Not on file  Physical Activity: Not on file  Stress: Not on file  Social Connections: Not on file  Intimate Partner  Violence: Not on file     Physical Exam   Vitals:   04/27/23 0556  BP: (!) 133/90  Pulse: 85  Resp: 20  Temp: 98 F (36.7 C)  SpO2: 99%    CONSTITUTIONAL: Well-appearing, NAD NEURO/PSYCH:  Alert and oriented x 3, no focal deficits EYES:  eyes equal and reactive ENT/NECK:  no LAD, no JVD CARDIO: Regular rate, well-perfused, normal S1 and S2 PULM:  CTAB no wheezing or rhonchi GI/GU:  non-distended, mild right lower quadrant tenderness to palpation MSK/SPINE:  No gross deformities, no edema SKIN:  no rash, atraumatic   *Additional and/or pertinent findings included in MDM below  Diagnostic and Interventional Summary    EKG Interpretation  Date/Time:    Ventricular Rate:    PR Interval:    QRS Duration:   QT Interval:    QTC Calculation:   R Axis:     Text Interpretation:         Labs Reviewed  LIPASE, BLOOD  COMPREHENSIVE METABOLIC PANEL  CBC  URINALYSIS, ROUTINE W REFLEX MICROSCOPIC  POC URINE PREG, ED    CT ABDOMEN PELVIS W CONTRAST    (Results Pending)    Medications  ondansetron (ZOFRAN) injection 4 mg (4 mg Intravenous Given 04/27/23 0607)  ketorolac (TORADOL) 15 MG/ML injection 15 mg (  15 mg Intravenous Given 04/27/23 0607)  sodium chloride 0.9 % bolus 1,000 mL (1,000 mLs Intravenous New Bag/Given 04/27/23 0607)     Procedures  /  Critical Care Procedures  ED Course and Medical Decision Making  Initial Impression and Ddx Suspicious for appendicitis versus ovarian cyst.  Doubt torsion.  Gastroenteritis or food poisoning is also considered.  Patient stumbled and fell after eating at [employer] last night.  Has some abrasions to the dorsal aspect of her fingers on the right hand, family asked me to evaluate them.  The middle finger abrasion is a bit deep but there is missing tissue and there is no benefit to suture repair, will dress with Xeroform gauze.  Past medical/surgical history that increases complexity of ED encounter: None  Interpretation of  Diagnostics Awaiting labs, CT imaging.  Patient Reassessment and Ultimate Disposition/Management     Signed out to oncoming provider at shift change.  Patient management required discussion with the following services or consulting groups:  None  Complexity of Problems Addressed Acute illness or injury that poses threat of life of bodily function  Additional Data Reviewed and Analyzed Further history obtained from: Further history from spouse/family member  Additional Factors Impacting ED Encounter Risk Consideration of hospitalization  Anne Sow. Pilar Plate, MD [facility] Emergency Medicine [facility] [email or web address]  Final Clinical Impressions(s) / ED Diagnoses     ICD-10-CM   1. Right lower quadrant abdominal pain  R10.31     2. Nausea and vomiting, unspecified vomiting type  R11.2       ED Discharge Orders     None        Discharge Instructions Discussed with and Provided to Patient:   Discharge Instructions   None      Anne Sous, MD 04/27/23 [phone]

## 2023-04-27 NOTE — ED Provider Notes (Signed)
Signout from Dr. Pilar Plate.  22 year old female complaining of right lower quadrant abdominal pain nausea vomiting that started yesterday.  Mild lower abdominal tenderness.  Getting lab work and CT imaging.  Disposition per results of testing. Physical Exam  BP 122/72   Pulse 80   Temp 98 F (36.7 C) (Oral)   Resp 16   Ht 5\' 5"  (1.651 m)   Wt 65.8 kg   LMP 03/29/2023 (Approximate)   SpO2 100%   BMI 24.13 kg/m   Physical Exam  Procedures  Procedures  ED Course / MDM    Medical Decision Making Amount and/or Complexity of Data Reviewed Labs: ordered. Radiology: ordered.  Risk Prescription drug management.   Patient states her pain is resolved.  Lab work showing mildly low potassium normal white count.  Urinalysis with possible infection.  Pregnancy test negative.  Reviewed with patient.  Will cover with antibiotics and return instructions discussed.       Terrilee Files, MD 04/27/23 [phone]

## 2023-04-27 NOTE — Discharge Instructions (Signed)
You were seen in the emergency department for lower abdominal pain.  You had blood work urinalysis and a CAT scan of your abdomen and pelvis.  Your urine showed possible signs of infection we are starting you on an antibiotic.  Please start with a clear liquid diet advance as tolerated.  Return to the emergency department if worsening pain or high fever.

## 2023-04-27 NOTE — ED Triage Notes (Signed)
Pt with c/o abdominal pain and 2 episodes of vomiting since midnight.

## 2023-04-27 NOTE — ED Notes (Signed)
Patient transported to CT 

## 2023-04-27 NOTE — ED Notes (Signed)
Pt back from CT

## 2023-04-27 NOTE — ED Notes (Signed)
Pt states no nausea. Pt given water to attempt PO challenge at this time.

## 2023-04-28 ENCOUNTER — Telehealth: Payer: Self-pay

## 2023-04-28 LAB — URINE CULTURE: Culture: >=100000 — AB

## 2023-04-28 NOTE — Telephone Encounter (Signed)
Transition Care Management Unsuccessful Follow-up Telephone Call  Date of discharge and from where:  04/27/23 [facility]  Attempts:  1st Attempt  Reason for unsuccessful TCM follow-up call:  Left voice message

## 2023-04-29 LAB — URINE CULTURE

## 2023-04-30 NOTE — Telephone Encounter (Signed)
Post ED Visit - Positive Culture Follow-up  Culture report reviewed by antimicrobial stewardship pharmacist: [facility] [x]  [facility], Pharm.D. []  Celedonio Miyamoto, Pharm.D., BCPS AQ-ID []  Garvin Fila, Pharm.D., BCPS []  Georgina Pillion, Pharm.D., BCPS []  [place], [address].D., BCPS, AAHIVP []  Estella Husk, Pharm.D., BCPS, AAHIVP []  Lysle Pearl, PharmD, BCPS []  Phillips Climes, PharmD, BCPS []  Agapito Games, PharmD, BCPS []  Verlan Friends, PharmD []  Mervyn Gay, PharmD, BCPS []  Vinnie Level, PharmD  [facility] []  Len Childs, PharmD []  Greer Pickerel, PharmD []  Adalberto Cole, PharmD []  Perlie Gold, Rph []  Lonell Face) Jean Rosenthal, PharmD []  Earl Many, PharmD []  Junita Push, PharmD []  Dorna Leitz, PharmD []  Terrilee Files, PharmD []  Lynann Beaver, PharmD []  Keturah Barre, PharmD []  Loralee Pacas, PharmD []  Bernadene Person, PharmD   Positive urine culture Treated with Cephalexin, organism sensitive to the same and no further patient follow-up is required at this time.  Nena Polio Garner Nash 04/30/2023, 2:35 PM

## 2023-05-27 ENCOUNTER — Ambulatory Visit: Payer: Self-pay

## 2024-02-25 LAB — HM PAP SMEAR

## 2024-06-24 ENCOUNTER — Encounter: Payer: Self-pay | Admitting: Family

## 2024-06-24 ENCOUNTER — Encounter: Payer: Self-pay | Admitting: Obstetrics and Gynecology

## 2024-06-24 ENCOUNTER — Ambulatory Visit: Admitting: Family

## 2024-06-24 VITALS — BP 122/83 | HR 105 | Temp 98.1°F | Ht 65.0 in | Wt 150.6 lb

## 2024-06-24 DIAGNOSIS — Z23 Encounter for immunization: Secondary | ICD-10-CM | POA: Diagnosis not present

## 2024-06-24 DIAGNOSIS — J4520 Mild intermittent asthma, uncomplicated: Secondary | ICD-10-CM

## 2024-06-24 DIAGNOSIS — Z0000 Encounter for general adult medical examination without abnormal findings: Secondary | ICD-10-CM

## 2024-06-24 DIAGNOSIS — J45990 Exercise induced bronchospasm: Secondary | ICD-10-CM | POA: Diagnosis not present

## 2024-06-24 DIAGNOSIS — Z0001 Encounter for general adult medical examination with abnormal findings: Secondary | ICD-10-CM | POA: Diagnosis not present

## 2024-06-24 DIAGNOSIS — Z91010 Allergy to peanuts: Secondary | ICD-10-CM

## 2024-06-24 LAB — LIPID PANEL

## 2024-06-24 MED ORDER — EPINEPHRINE 0.3 MG/0.3ML IJ SOAJ
0.3000 mg | INTRAMUSCULAR | 1 refills | Status: AC | PRN
Start: 2024-06-24 — End: ?

## 2024-06-24 MED ORDER — ALBUTEROL SULFATE HFA 108 (90 BASE) MCG/ACT IN AERS
2.0000 | INHALATION_SPRAY | Freq: Four times a day (QID) | RESPIRATORY_TRACT | 2 refills | Status: AC | PRN
Start: 2024-06-24 — End: ?

## 2024-06-24 NOTE — Progress Notes (Signed)
 Subjective:    Patient ID: Anne Pace, female    DOB: 06/24/2001, 23 y.o.   MRN: [id number]  Chief Complaint  Patient presents with   Annual Exam   PT presents to the office today with CPE without pap. She is followed by GYN annually.   She has a peanut allergy and has an Epi pen she can use as needed.  Asthma There is no cough, shortness of breath or wheezing. This is a chronic problem. The current episode started more than 1 year ago. Her symptoms are aggravated by pollen. Her symptoms are alleviated by beta-agonist. She reports moderate improvement on treatment. Her past medical history is significant for asthma.      Review of Systems  Respiratory:  Negative for cough, shortness of breath and wheezing.   All other systems reviewed and are negative.  Family History  Problem Relation Age of Onset   Hypertension Brother    Ovarian cancer Maternal Grandmother        & cervical   Diabetes Maternal Grandfather    Ovarian cancer Paternal Grandmother        & cervical   Social History   Socioeconomic History   Marital status: Single    Spouse name: Not on file   Number of children: Not on file   Years of education: Not on file   Highest education level: Not on file  Occupational History   Not on file  Tobacco Use   Smoking status: Never   Smokeless tobacco: Never  Substance and Sexual Activity   Alcohol use: Never   Drug use: Never   Sexual activity: Not Currently    Partners: Male    Birth control/protection: Abstinence  Other Topics Concern   Not on file  Social History Narrative   Not on file   Social Drivers of Health   Financial Resource Strain: Not on file  Food Insecurity: Not on file  Transportation Needs: Not on file  Physical Activity: Not on file  Stress: Not on file  Social Connections: Not on file        Objective:   Physical Exam Vitals reviewed.  Constitutional:      General: She is not in acute distress.    Appearance: She is  well-developed.  HENT:     Head: Normocephalic and atraumatic.     Right Ear: Tympanic membrane normal.     Left Ear: Tympanic membrane normal.  Eyes:     Pupils: Pupils are equal, round, and reactive to light.  Neck:     Thyroid: No thyromegaly.  Cardiovascular:     Rate and Rhythm: Normal rate and regular rhythm.     Heart sounds: Normal heart sounds. No murmur heard. Pulmonary:     Effort: Pulmonary effort is normal. No respiratory distress.     Breath sounds: Normal breath sounds. No wheezing.  Abdominal:     General: Bowel sounds are normal. There is no distension.     Palpations: Abdomen is soft.     Tenderness: There is no abdominal tenderness.  Musculoskeletal:        General: No tenderness. Normal range of motion.     Cervical back: Normal range of motion and neck supple.  Skin:    General: Skin is warm and dry.  Neurological:     Mental Status: She is alert and oriented to person, place, and time.     Cranial Nerves: No cranial nerve deficit.     Deep  Tendon Reflexes: Reflexes are normal and symmetric.  Psychiatric:        Behavior: Behavior normal.        Thought Content: Thought content normal.        Judgment: Judgment normal.     BP 122/83   Pulse (!) 105   Temp 98.1 F (36.7 C) (Temporal)   Ht 5' 5 (1.651 m)   Wt 150 lb 9.6 oz (68.3 kg)   BMI 25.06 kg/m      Assessment & Plan:  MADDISEN VOUGHT comes in today with chief complaint of Annual Exam   Diagnosis and orders addressed: 1. Annual physical exam (Primary) - CMP14+EGFR - CBC with Differential/Platelet - Lipid panel  2. Exercise-induced asthma - CMP14+EGFR - CBC with Differential/Platelet  3. Asthma, chronic, mild intermittent, uncomplicated - CMP14+EGFR - albuterol  (VENTOLIN  HFA) 108 (90 Base) MCG/ACT inhaler; Inhale 2 puffs into the lungs every 6 (six) hours as needed for wheezing.  Dispense: 1 each; Refill: 2  4. Allergic to peanuts - CMP14+EGFR - EPINEPHrine  0.3 mg/0.3 mL IJ SOAJ  injection; Inject 0.3 mg into the muscle as needed for anaphylaxis.  Dispense: 1 each; Refill: 1   Labs pending Health Maintenance reviewed Diet and exercise encouraged  Follow up plan: 1 year    Bari Learn, FNP

## 2024-06-24 NOTE — Patient Instructions (Signed)

## 2024-06-25 ENCOUNTER — Ambulatory Visit: Payer: Self-pay | Admitting: Family

## 2024-06-25 LAB — CMP14+EGFR
ALT: 9 IU/L (ref 0–32)
AST: 17 IU/L (ref 0–40)
Albumin: 4.3 g/dL (ref 4.0–5.0)
Alkaline Phosphatase: 105 IU/L (ref 44–121)
BUN/Creatinine Ratio: 15 (ref 9–23)
BUN: 12 mg/dL (ref 6–20)
Bilirubin Total: 1.2 mg/dL (ref 0.0–1.2)
CO2: 20 mmol/L (ref 20–29)
Calcium: 9.8 mg/dL (ref 8.7–10.2)
Chloride: 100 mmol/L (ref 96–106)
Creatinine, Ser: 0.82 mg/dL (ref 0.57–1.00)
Globulin, Total: 3.3 g/dL (ref 1.5–4.5)
Glucose: 85 mg/dL (ref 70–99)
Potassium: 4.7 mmol/L (ref 3.5–5.2)
Sodium: 136 mmol/L (ref 134–144)
Total Protein: 7.6 g/dL (ref 6.0–8.5)
eGFR: 103 mL/min/1.73 (ref >59)

## 2024-06-25 LAB — LIPID PANEL
Chol/HDL Ratio: 2.5 ratio (ref 0.0–4.4)
Cholesterol, Total: 150 mg/dL (ref 100–199)
HDL: 59 mg/dL (ref >39)
LDL Chol Calc (NIH): 82 mg/dL (ref 0–99)
Triglycerides: 41 mg/dL (ref 0–149)
VLDL Cholesterol Cal: 9 mg/dL (ref 5–40)

## 2024-06-25 LAB — CBC WITH DIFFERENTIAL/PLATELET
Basophils Absolute: 0 x10E3/uL (ref 0.0–0.2)
Basos: 1 %
EOS (ABSOLUTE): 0.1 x10E3/uL (ref 0.0–0.4)
Eos: 1 %
Hematocrit: 42.5 % (ref 34.0–46.6)
Hemoglobin: 13 g/dL (ref 11.1–15.9)
Immature Grans (Abs): 0 x10E3/uL (ref 0.0–0.1)
Immature Granulocytes: 0 %
Lymphocytes Absolute: 1.6 x10E3/uL (ref 0.7–3.1)
Lymphs: 43 %
MCH: 25.2 pg — ABNORMAL LOW (ref 26.6–33.0)
MCHC: 30.6 g/dL — ABNORMAL LOW (ref 31.5–35.7)
MCV: 83 fL (ref 79–97)
Monocytes Absolute: 0.3 x10E3/uL (ref 0.1–0.9)
Monocytes: 8 %
Neutrophils Absolute: 1.7 x10E3/uL (ref 1.4–7.0)
Neutrophils: 47 %
Platelets: 430 x10E3/uL (ref 150–450)
RBC: 5.15 x10E6/uL (ref 3.77–5.28)
RDW: 14.6 % (ref 11.7–15.4)
WBC: 3.6 x10E3/uL (ref 3.4–10.8)

## 2024-08-06 ENCOUNTER — Ambulatory Visit

## 2024-08-06 ENCOUNTER — Ambulatory Visit: Payer: Self-pay

## 2024-08-06 VITALS — BP 134/86 | HR 115 | Temp 100.6°F | Ht 65.0 in | Wt 148.1 lb

## 2024-08-06 DIAGNOSIS — J069 Acute upper respiratory infection, unspecified: Secondary | ICD-10-CM | POA: Diagnosis not present

## 2024-08-06 MED ORDER — PROMETHAZINE-DM 6.25-15 MG/5ML PO SYRP
5.0000 mL | ORAL_SOLUTION | Freq: Four times a day (QID) | ORAL | 0 refills | Status: AC | PRN
Start: 2024-08-06 — End: ?

## 2024-08-06 NOTE — Progress Notes (Signed)
   Acute Office Visit  Subjective:     Patient ID: Anne Pace, female    DOB: 2001-09-25, 23 y.o.   MRN: [id number]  Chief Complaint  Patient presents with   Medical Management of Chronic Issues    Pt here for Sore throat,Fever, Congestion    HPI Patient is in today for sore throat, fever & congestion  ROS     Objective:    BP 134/86   Pulse (!) 115   Temp (!) 100.6 F (38.1 C) (Oral)   Ht 5' 5 (1.651 m)   Wt 148 lb 1.9 oz (67.2 kg)   SpO2 98%   BMI 24.65 kg/m  BP Readings from Last 3 Encounters:  08/06/24 134/86  06/24/24 122/83  04/27/23 122/72   Wt Readings from Last 3 Encounters:  08/06/24 148 lb 1.9 oz (67.2 kg)  06/24/24 150 lb 9.6 oz (68.3 kg)  04/27/23 145 lb (65.8 kg)     Physical Exam Vitals and nursing note reviewed.  Constitutional:      Appearance: Normal appearance.  HENT:     Head: Normocephalic.     Right Ear: Tympanic membrane, ear canal and external ear normal.     Left Ear: Tympanic membrane, ear canal and external ear normal.     Nose: Rhinorrhea present.     Mouth/Throat:     Mouth: Mucous membranes are moist.     Pharynx: Oropharynx is clear.  Eyes:     Extraocular Movements: Extraocular movements intact.     Pupils: Pupils are equal, round, and reactive to light.  Cardiovascular:     Rate and Rhythm: Normal rate and regular rhythm.  Pulmonary:     Effort: Pulmonary effort is normal.     Breath sounds: Normal breath sounds.  Musculoskeletal:     Cervical back: Normal range of motion and neck supple.  Skin:    General: Skin is warm.  Neurological:     Mental Status: She is alert and oriented to person, place, and time.  Psychiatric:        Mood and Affect: Mood normal.        Thought Content: Thought content normal.   No results found for any visits on 08/06/24.     Assessment & Plan:   Problem List Items Addressed This Visit   None Visit Diagnoses       Upper respiratory tract infection, unspecified type    -   Primary   likely viral in nature, as she had a negative Covid and flu test yesterday.  prescription cough medication added.  recommend increasing fluid intake as well.   Relevant Medications   promethazine -dextromethorphan (PROMETHAZINE -DM) 6.25-15 MG/5ML syrup       Meds ordered this encounter  Medications   promethazine -dextromethorphan (PROMETHAZINE -DM) 6.25-15 MG/5ML syrup    Sig: Take 5 mLs by mouth 4 (four) times daily as needed for cough.    Dispense:  118 mL    Refill:  0    No follow-ups on file.  Leita Longs, FNP

## 2024-08-06 NOTE — Telephone Encounter (Signed)
 Schedule patient at another office.

## 2024-08-06 NOTE — Telephone Encounter (Signed)
  FYI Only or Action Required?: FYI only for provider.  Patient was last seen in primary care on 06/24/2024 by Lavell Bari LABOR, FNP.  Called Nurse Triage reporting Sore Throat.  Symptoms began yesterday.  Interventions attempted: Nothing.  Symptoms are: gradually worsening.  Triage Disposition: See PCP When Office is Open (Within 3 Days)  Patient/caregiver understands and will follow disposition?: Yes  Copied from CRM #[id number]. Topic: Clinical - Red Word Triage >> Aug 06, 2024 11:05 AM Jasmin G wrote: Red Word that prompted to Nurse Triage: Pt's mother initially called requesting pt to get tested for strep throat. Pt symptoms are lots of congestion, fever that started yesterday and her throat hurts pretty bad as well. Reason for Disposition  [1] Sore throat is the only symptom AND [2] present > 48 hours  Answer Assessment - Initial Assessment Questions 1. ONSET: When did the throat start hurting? (Hours or days ago)      Two days ago 2. SEVERITY: How bad is the sore throat? (Scale 1-10; mild, moderate or severe)     Hurts to swallow.  Sharp pain in throat 3. STREP EXPOSURE: Has there been any exposure to strep within the past week? If Yes, ask: What type of contact occurred?      unknown 4.  VIRAL SYMPTOMS: Are there any symptoms of a cold, such as a runny nose, cough, hoarse voice or red eyes?      Runny nose, denies other symptoms 5. FEVER: Do you have a fever? If Yes, ask: What is your temperature, how was it measured, and when did it start?     Not measured, but getting chills 6. PUS ON THE TONSILS: Is there pus on the tonsils in the back of your throat?     Tonsils ar big and red 7. OTHER SYMPTOMS: Do you have any other symptoms? (e.g., difficulty breathing, headache, rash)     Positive for mild headache, denies rash 8. PREGNANCY: Is there any chance you are pregnant? When was your last menstrual period?     07/23/2024  Protocols used: Sore  Throat-A-AH

## 2025-01-28 ENCOUNTER — Ambulatory Visit: Admitting: Family
